# Patient Record
Sex: Male | Born: 1995 | Race: White | Hispanic: No | Marital: Married | Smoking: Never smoker
Health system: Southern US, Community
[De-identification: ages and names within clinical notes are randomized; demographics above are authoritative.]

---

## 2016-12-24 ENCOUNTER — Ambulatory Visit: Payer: Self-pay | Admitting: Family Medicine

## 2018-10-07 ENCOUNTER — Other Ambulatory Visit: Payer: Self-pay

## 2018-10-07 ENCOUNTER — Emergency Department (INDEPENDENT_AMBULATORY_CARE_PROVIDER_SITE_OTHER)
Admission: EM | Admit: 2018-10-07 | Discharge: 2018-10-07 | Disposition: A | Discharge status: Home / Self Care | Payer: 59 | Source: Home / Self Care | Attending: Family Medicine | Admitting: Family Medicine

## 2018-10-07 ENCOUNTER — Emergency Department (INDEPENDENT_AMBULATORY_CARE_PROVIDER_SITE_OTHER): Payer: 59

## 2018-10-07 DIAGNOSIS — S39012A Strain of muscle, fascia and tendon of lower back, initial encounter: Secondary | ICD-10-CM | POA: Diagnosis not present

## 2018-10-07 DIAGNOSIS — M545 Low back pain: Secondary | ICD-10-CM | POA: Diagnosis not present

## 2018-10-07 NOTE — ED Triage Notes (Signed)
Pt c/o lower back pain since yesterday. Was raking yard when he felt a sudden sharp shooting pain. Feels better today but said his mother sees some bruising on his back and his pain is still a 5/10. Took tylenol prn and also tried heating pad with some relief.

## 2018-10-07 NOTE — ED Provider Notes (Signed)
[facility]    CSN: [id number] Arrival date & time: 10/07/18  1239     History   Chief Complaint Chief Complaint  Patient presents with   Back Pain    HPI Terry Banks is a 23 y.o. male.   While raking yard yesterday, patient suddenly experienced a sharp pain in his coccyx and right lower back.  He is somewhat better today but the pain persists.   He denies bowel or bladder dysfunction, and no saddle numbness.   The history is provided by the patient.  Back Pain Location:  Lumbar spine Quality:  Stabbing and burning Pain severity:  Moderate Pain is:  Same all the time Onset quality:  Unable to specify Duration:  1 day Timing:  Constant Progression:  Improving Chronicity:  New Context comment:  Raking Relieved by:  Nothing Worsened by:  Bending, movement and twisting Ineffective treatments: Flexeril and Tylenol. Associated symptoms: no abdominal pain, no abdominal swelling, no bladder incontinence, no bowel incontinence, no dysuria, no fever, no leg pain, no numbness, no paresthesias, no pelvic pain, no perianal numbness, no tingling, no weakness and no weight loss     History reviewed. No pertinent past medical history.  There are no active problems to display for this patient.   History reviewed. No pertinent surgical history.     Home Medications    Prior to Admission medications   Not on File    Family History History reviewed. No pertinent family history.  Social History Social History   Tobacco Use   Smoking status: Never Smoker   Smokeless tobacco: Never Used  Substance Use Topics   Alcohol use: Yes    Comment: occ   Drug use: Never     Allergies   Patient has no known allergies.   Review of Systems Review of Systems  Constitutional: Negative for chills, diaphoresis, fatigue, fever and weight loss.  Gastrointestinal: Negative for abdominal pain and bowel incontinence.  Genitourinary: Negative for bladder  incontinence, dysuria and pelvic pain.  Musculoskeletal: Positive for back pain.  Skin: Negative for rash.  Neurological: Negative for tingling, weakness, numbness and paresthesias.  All other systems reviewed and are negative.    Physical Exam Triage Vital Signs ED Triage Vitals  Enc Vitals Group     BP 10/07/18 1302 134/76     Pulse Rate 10/07/18 1302 99     Resp 10/07/18 1302 18     Temp 10/07/18 1302 97.6 F (36.4 C)     Temp Source 10/07/18 1302 Oral     SpO2 10/07/18 1302 98 %     Weight 10/07/18 1303 210 lb (95.3 kg)     Height 10/07/18 1303 6' (1.829 m)     Head Circumference --      Peak Flow --      Pain Score 10/07/18 1303 5     Pain Loc --      Pain Edu? --      Excl. in [place]? --    No data found.  Updated Vital Signs BP 134/76 (BP Location: Right Arm)    Pulse 99    Temp 97.6 F (36.4 C) (Oral)    Resp 18    Ht 6' (1.829 m)    Wt 95.3 kg    SpO2 98%    BMI 28.48 kg/m   Visual Acuity Right Eye Distance:   Left Eye Distance:   Bilateral Distance:    Right Eye Near:   Left Eye Near:  Bilateral Near:     Physical Exam Vitals signs and nursing note reviewed.  Constitutional:      General: He is not in acute distress.    Appearance: He is not ill-appearing.  HENT:     Head: Normocephalic.     Nose: Nose normal.     Mouth/Throat:     Pharynx: Oropharynx is clear.  Eyes:     Pupils: Pupils are equal, round, and reactive to light.  Neck:     Musculoskeletal: Normal range of motion.  Cardiovascular:     Heart sounds: Normal heart sounds.  Pulmonary:     Breath sounds: Normal breath sounds.  Abdominal:     Tenderness: There is no abdominal tenderness.  Musculoskeletal:       Back:     Right lower leg: No edema.     Left lower leg: No edema.     Comments: Back:  Range of motion relatively well preserved.  Can heel/toe walk and squat without difficulty.   Tenderness in the midline below L4, and right sacrum and SI joint.  Straight leg raising test  is negative.  Sitting knee extension test is negative.  Strength and sensation in the lower extremities is normal.  Patellar and achilles reflexes are normal   Skin:    General: Skin is warm and dry.     Findings: No rash.  Neurological:     Mental Status: He is alert.      UC Treatments / Results  Labs (all labs ordered are listed, but only abnormal results are displayed) Labs Reviewed - No data to display  EKG   Radiology Dg Lumbar Spine Complete  Result Date: 10/07/2018 CLINICAL DATA:  23 year old male with sudden onset of low back pain while raking leaves yesterday. EXAM: LUMBAR SPINE - COMPLETE 4+ VIEW COMPARISON:  None. FINDINGS: There is no evidence of lumbar spine fracture. Alignment is normal. Intervertebral disc spaces are maintained. IMPRESSION: Negative. Electronically Signed   By: Malachy MoanHeath  McCullough M.D.   On: 10/07/2018 14:15    Procedures Procedures (including critical care time)  Medications Ordered in UC Medications - No data to display  Initial Impression / Assessment and Plan / UC Course  I have reviewed the triage vital signs and the nursing notes.  Pertinent labs & imaging results that were available during my care of the patient were reviewed by me and considered in my medical decision making (see chart for details).    Followup with Dr. Rodney Langtonhomas Thekkekandam or Dr. Clementeen GrahamEvan Corey ([facility]) if not improving about three weeks.   Final Clinical Impressions(s) / UC Diagnoses   Final diagnoses:  Strain of lumbar region, initial encounter     Discharge Instructions     Apply ice pack for 20 to 30 minutes, 3 to 4 times daily  Continue until pain and swelling decrease.  Take Ibuprofen 200mg , 4 tabs every 8 hours with food.  May take Tylenol as needed for pain. Begin range of motion and stretching exercises as tolerated.    ED Prescriptions    None        Lattie HawBeese, Dandra Velardi A, MD 10/09/18 1942

## 2018-10-07 NOTE — Discharge Instructions (Addendum)
Apply ice pack for 20 to 30 minutes, 3 to 4 times daily  Continue until pain and swelling decrease.  Take Ibuprofen 200mg , 4 tabs every 8 hours with food.  May take Tylenol as needed for pain. Begin range of motion and stretching exercises as tolerated.

## 2021-03-23 IMAGING — DX LUMBAR SPINE - COMPLETE 4+ VIEW
5 series · 5 of 5 positions shown · non-contrast
Comparison: None.

CLINICAL DATA: 22-year-old male with sudden onset of low back pain
while raking leaves yesterday.

EXAM:
LUMBAR SPINE - COMPLETE 4+ VIEW

[l-spine ap]
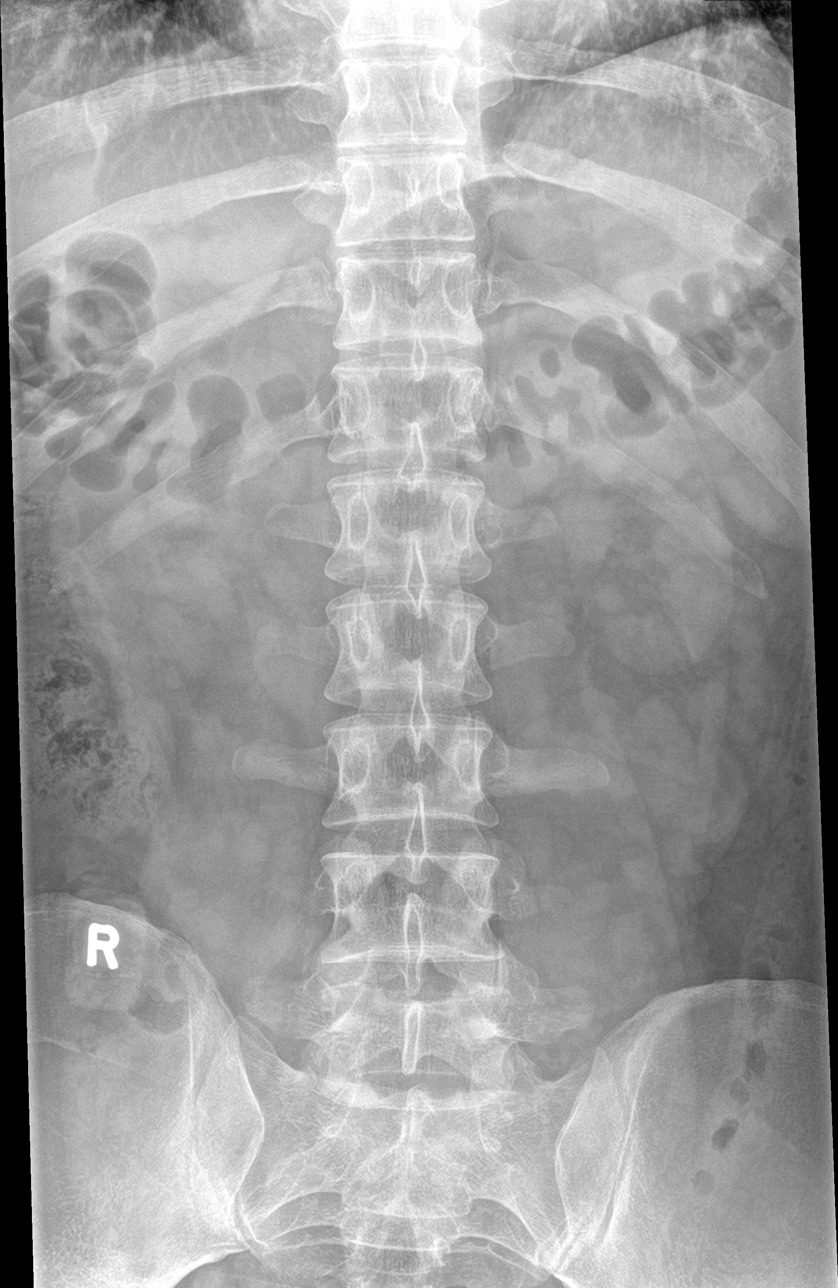

[l-spine obl (1 of 2)]
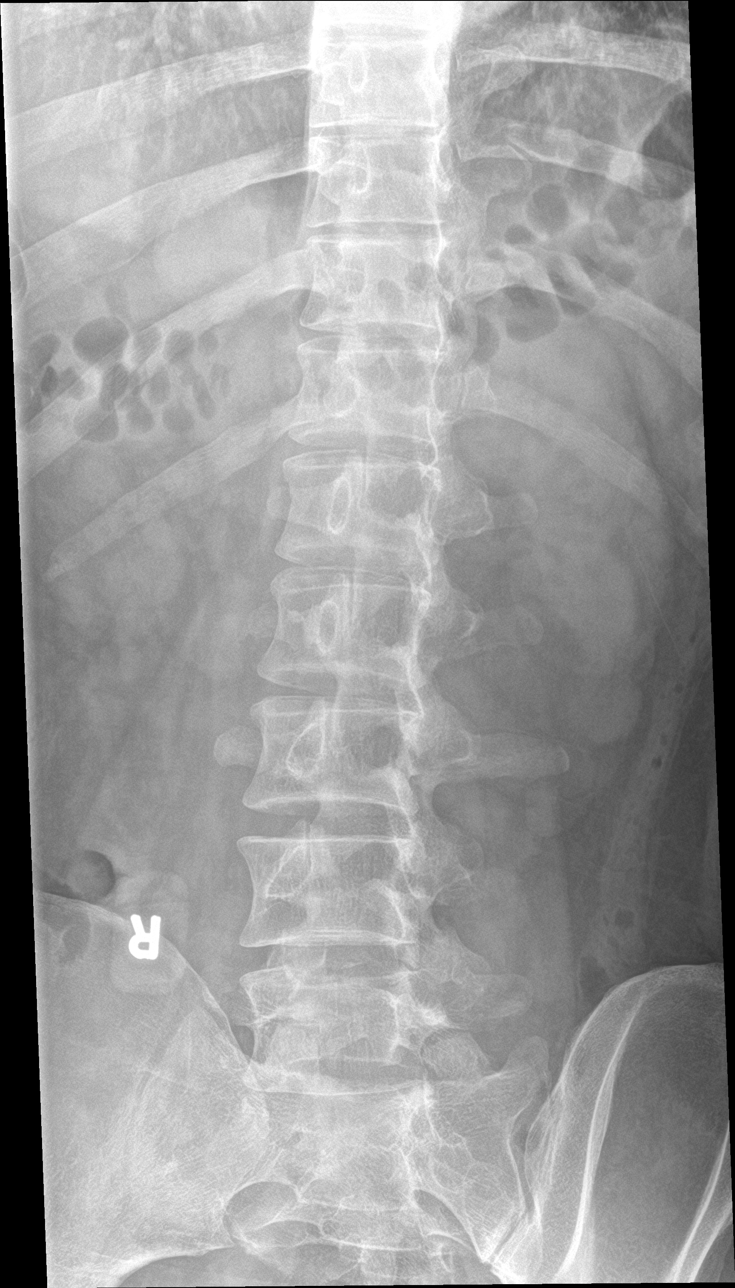

[l-spine obl (2 of 2)]
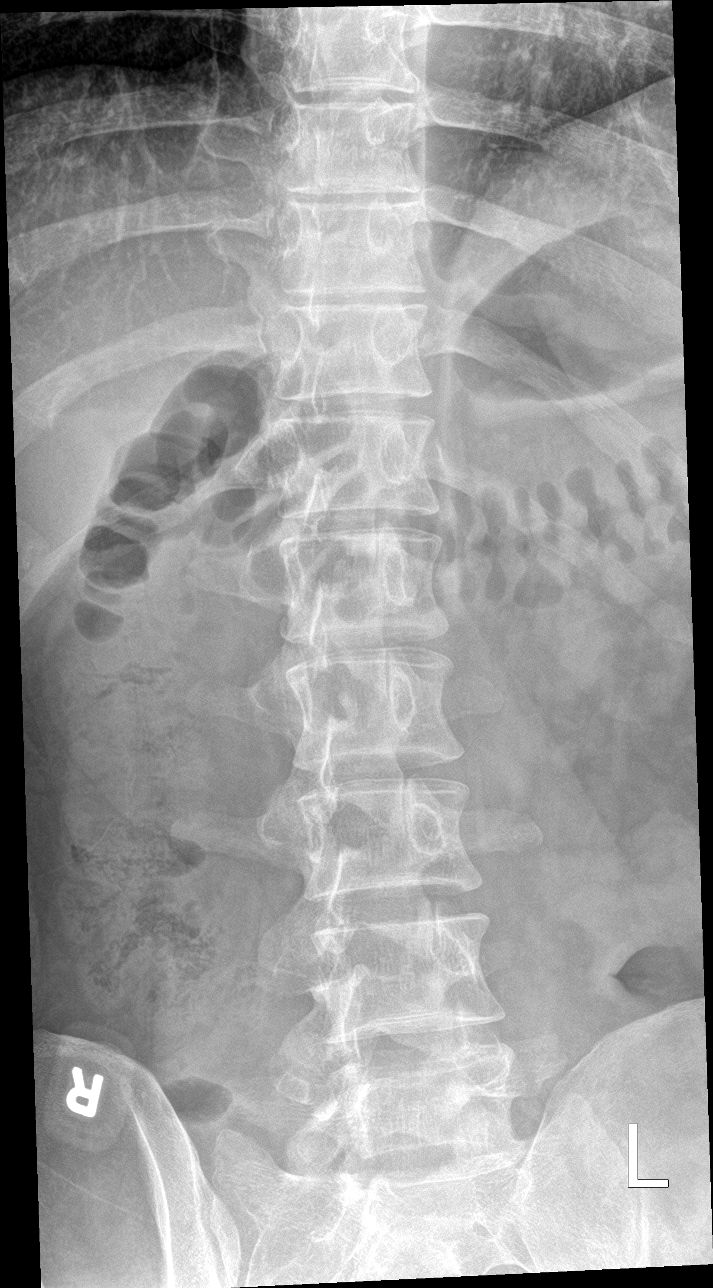

[l-spine lat]
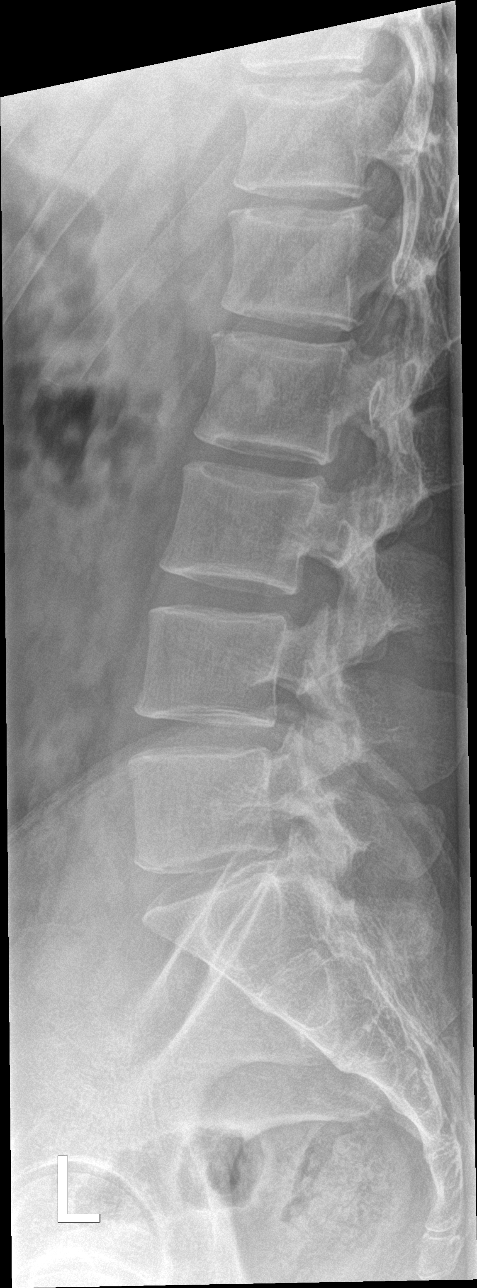

[l-spine spot]
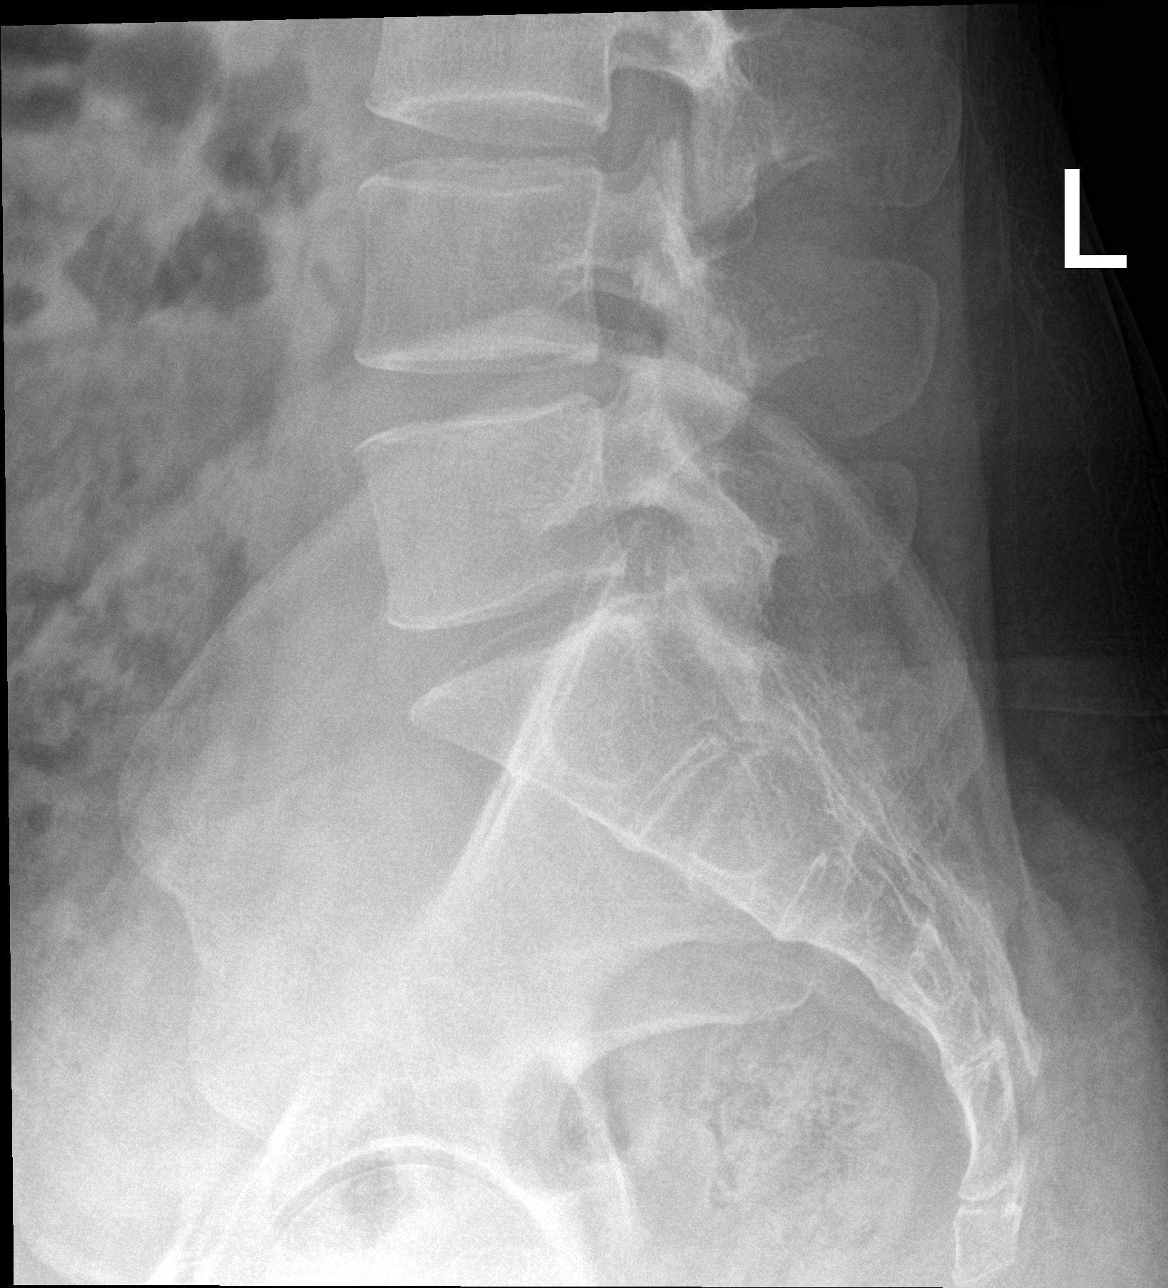

[5 of 5 positions shown; findings below may reference images not displayed]

FINDINGS: There is no evidence of lumbar spine fracture. Alignment is normal.
Intervertebral disc spaces are maintained.
IMPRESSION: Negative.
